# Patient Record
Sex: Male | Born: 1965 | Race: White | Hispanic: No | Smoking: Never smoker
Health system: Southern US, Community
[De-identification: ages and names within clinical notes are randomized; demographics above are authoritative.]

## PROBLEM LIST (undated history)

## (undated) DIAGNOSIS — M109 Gout, unspecified: Secondary | ICD-10-CM

## (undated) DIAGNOSIS — R569 Unspecified convulsions: Secondary | ICD-10-CM

## (undated) DIAGNOSIS — I82409 Acute embolism and thrombosis of unspecified deep veins of unspecified lower extremity: Secondary | ICD-10-CM

## (undated) DIAGNOSIS — I10 Essential (primary) hypertension: Secondary | ICD-10-CM

## (undated) HISTORY — PX: KNEE SURGERY: SHX244

## (undated) HISTORY — PX: ELBOW SURGERY: SHX618

---

## 2015-07-22 ENCOUNTER — Emergency Department (HOSPITAL_COMMUNITY)
Admission: EM | Admit: 2015-07-22 | Discharge: 2015-07-22 | Disposition: A | Discharge status: Home / Self Care | Attending: Emergency Medicine | Admitting: Emergency Medicine

## 2015-07-22 ENCOUNTER — Encounter (HOSPITAL_COMMUNITY): Payer: Self-pay | Admitting: Emergency Medicine

## 2015-07-22 DIAGNOSIS — M10041 Idiopathic gout, right hand: Secondary | ICD-10-CM | POA: Insufficient documentation

## 2015-07-22 DIAGNOSIS — M109 Gout, unspecified: Secondary | ICD-10-CM

## 2015-07-22 DIAGNOSIS — M10042 Idiopathic gout, left hand: Secondary | ICD-10-CM | POA: Diagnosis not present

## 2015-07-22 DIAGNOSIS — I10 Essential (primary) hypertension: Secondary | ICD-10-CM | POA: Insufficient documentation

## 2015-07-22 DIAGNOSIS — Z86718 Personal history of other venous thrombosis and embolism: Secondary | ICD-10-CM | POA: Diagnosis not present

## 2015-07-22 HISTORY — DX: Essential (primary) hypertension: I10

## 2015-07-22 HISTORY — DX: Gout, unspecified: M10.9

## 2015-07-22 HISTORY — DX: Acute embolism and thrombosis of unspecified deep veins of unspecified lower extremity: I82.409

## 2015-07-22 LAB — COMPREHENSIVE METABOLIC PANEL
ALT: 19 U/L (ref 17–63)
AST: 24 U/L (ref 15–41)
Albumin: 3.5 g/dL (ref 3.5–5.0)
Alkaline Phosphatase: 75 U/L (ref 38–126)
Anion gap: 11 (ref 5–15)
BUN: 18 mg/dL (ref 6–20)
CHLORIDE: 101 mmol/L (ref 101–111)
CO2: 21 mmol/L — AB (ref 22–32)
CREATININE: 1.39 mg/dL — AB (ref 0.61–1.24)
Calcium: 9.3 mg/dL (ref 8.9–10.3)
GFR calc Af Amer: >60 mL/min (ref >60)
GFR, EST NON AFRICAN AMERICAN: 59 mL/min — AB (ref >60)
Glucose, Bld: 122 mg/dL — ABNORMAL HIGH (ref 65–99)
POTASSIUM: 4 mmol/L (ref 3.5–5.1)
SODIUM: 133 mmol/L — AB (ref 135–145)
Total Bilirubin: 1.4 mg/dL — ABNORMAL HIGH (ref 0.3–1.2)
Total Protein: 8.1 g/dL (ref 6.5–8.1)

## 2015-07-22 LAB — CBC WITH DIFFERENTIAL/PLATELET
Basophils Absolute: 0 10*3/uL (ref 0.0–0.1)
Basophils Relative: 0 %
EOS ABS: 0 10*3/uL (ref 0.0–0.7)
Eosinophils Relative: 0 %
HCT: 38.5 % — ABNORMAL LOW (ref 39.0–52.0)
HEMOGLOBIN: 13.3 g/dL (ref 13.0–17.0)
LYMPHS ABS: 0.5 10*3/uL — AB (ref 0.7–4.0)
LYMPHS PCT: 4 %
MCH: 35 pg — AB (ref 26.0–34.0)
MCHC: 34.5 g/dL (ref 30.0–36.0)
MCV: 101.3 fL — AB (ref 78.0–100.0)
MONOS PCT: 10 %
Monocytes Absolute: 1.5 10*3/uL — ABNORMAL HIGH (ref 0.1–1.0)
NEUTROS PCT: 86 %
Neutro Abs: 12.5 10*3/uL — ABNORMAL HIGH (ref 1.7–7.7)
Platelets: 229 10*3/uL (ref 150–400)
RBC: 3.8 MIL/uL — AB (ref 4.22–5.81)
RDW: 13.1 % (ref 11.5–15.5)
WBC: 14.6 10*3/uL — AB (ref 4.0–10.5)

## 2015-07-22 LAB — URIC ACID: URIC ACID, SERUM: 7.2 mg/dL (ref 4.4–7.6)

## 2015-07-22 MED ORDER — CEFTRIAXONE SODIUM 250 MG IJ SOLR
250.0000 mg | Freq: Once | INTRAMUSCULAR | Status: AC
  Administered 2015-07-22: 250 mg via INTRAMUSCULAR
  Filled 2015-07-22: qty 250

## 2015-07-22 MED ORDER — INDOMETHACIN 50 MG PO CAPS: 50.0000 mg | ORAL_CAPSULE | Freq: Three times a day (TID) | ORAL | Status: DC

## 2015-07-22 MED ORDER — AZITHROMYCIN 250 MG PO TABS
1000.0000 mg | ORAL_TABLET | Freq: Once | ORAL | Status: AC
  Administered 2015-07-22: 1000 mg via ORAL
  Filled 2015-07-22: qty 4

## 2015-07-22 MED ORDER — PREDNISONE 50 MG PO TABS: ORAL_TABLET | ORAL | Status: DC

## 2015-07-22 MED ORDER — INDOMETHACIN 25 MG PO CAPS
50.0000 mg | ORAL_CAPSULE | Freq: Once | ORAL | Status: AC
  Administered 2015-07-22: 50 mg via ORAL
  Filled 2015-07-22: qty 2

## 2015-07-22 MED ORDER — LIDOCAINE HCL (PF) 1 % IJ SOLN
INTRAMUSCULAR | Status: AC
  Filled 2015-07-22: qty 5

## 2015-07-22 MED ORDER — PREDNISONE 50 MG PO TABS
60.0000 mg | ORAL_TABLET | Freq: Once | ORAL | Status: AC
  Administered 2015-07-22: 60 mg via ORAL
  Filled 2015-07-22: qty 1

## 2015-07-22 NOTE — ED Notes (Signed)
PT c/o pain and swelling to bilateral hands/wrist and bilateral feet and ankles with hx of gout x1 week.

## 2015-07-22 NOTE — Discharge Instructions (Signed)
Gout Gout is when your joints become red, sore, and swell (inflamed). This is caused by the buildup of uric acid crystals in the joints. Uric acid is a chemical that is normally in the blood. If the level of uric acid gets too high in the blood, these crystals form in your joints and tissues. Over time, these crystals can form into masses near the joints and tissues. These masses can destroy bone and cause the bone to look misshapen (deformed). HOME CARE   Do not take aspirin for pain.  Only take medicine as told by your doctor.  Rest the joint as much as you can. When in bed, keep sheets and blankets off painful areas.  Keep the sore joints raised (elevated).  Put warm or cold packs on painful joints. Use of warm or cold packs depends on which works best for you.  Use crutches if the painful joint is in your leg.  Drink enough fluids to keep your pee (urine) clear or pale yellow. Limit alcohol, sugary drinks, and drinks with fructose in them.  Follow your diet instructions. Pay careful attention to how much protein you eat. Include fruits, vegetables, whole grains, and fat-free or low-fat milk products in your daily diet. Talk to your doctor or dietitian about the use of coffee, vitamin C, and cherries. These may help lower uric acid levels.  Keep a healthy body weight. GET HELP RIGHT AWAY IF:   You have watery poop (diarrhea), throw up (vomit), or have any side effects from medicines.  You do not feel better in 24 hours, or you are getting worse.  Your joint becomes suddenly more tender, and you have chills or a fever. MAKE SURE YOU:   Understand these instructions.  Will watch your condition.  Will get help right away if you are not doing well or get worse.   This information is not intended to replace advice given to you by your health care provider. Make sure you discuss any questions you have with your health care provider.   Document Released: 03/26/2008 Document Revised:  07/08/2014 Document Reviewed: 01/29/2012 Elsevier Interactive Patient Education 2016 [employer].   Prescription for prednisone and indomethacin. Return if worse.

## 2015-07-22 NOTE — ED Provider Notes (Signed)
CSN: [id number]     Arrival date & time 07/22/15  1332 History   First MD Initiated Contact with Patient 07/22/15 1443     Chief Complaint  Patient presents with  . Gout     (Consider location/radiation/quality/duration/timing/severity/associated sxs/prior Treatment) HPI.... Patient with known history of gout presents with swelling in bilateral hands. Symptoms are similar to gout attacks in the past. No fever, sweats, chills, trauma. He is a inmate and does not have access to medications. Severity of pain is moderate.  Past Medical History  Diagnosis Date  . Gout   . Hypertension   . DVT (deep venous thrombosis) [facility])    Past Surgical History  Procedure Laterality Date  . Knee surgery    . Elbow surgery     History reviewed. No pertinent family history. Social History  Substance Use Topics  . Smoking status: Never Smoker   . Smokeless tobacco: None  . Alcohol Use: No    Review of Systems  All other systems reviewed and are negative.     Allergies  Codeine  Home Medications   Prior to Admission medications   Medication Sig Start Date End Date Taking? Authorizing Provider  indomethacin (INDOCIN) 50 MG capsule Take 1 capsule (50 mg total) by mouth 3 (three) times daily with meals. 07/22/15   Donnetta Hutching, MD  predniSONE (DELTASONE) 50 MG tablet 1 tablet for 6 days, one half tablet for 6 days 07/22/15   Donnetta Hutching, MD   BP 113/93 mmHg  Pulse 44  Temp(Src) 99.3 F (37.4 C) (Oral)  Resp 16  Ht  (1.905 m)  Wt 233 lb (105.688 kg)  BMI 29.12 kg/m2  SpO2 98% Physical Exam  Constitutional: He is oriented to person, place, and time. He appears well-developed and well-nourished.  HENT:  Head: Normocephalic and atraumatic.  Eyes: Conjunctivae and EOM are normal. Pupils are equal, round, and reactive to light.  Neck: Normal range of motion. Neck supple.  Musculoskeletal:  Bilateral hands are puffy but nontender. Full range of motion.  Neurological: He is alert and  oriented to person, place, and time.  Skin: Skin is warm and dry.  Psychiatric: He has a normal mood and affect. His behavior is normal.  Nursing note and vitals reviewed.   ED Course  Procedures (including critical care time) Labs Review Labs Reviewed  COMPREHENSIVE METABOLIC PANEL - Abnormal; Notable for the following:    Sodium 133 (*)    CO2 21 (*)    Glucose, Bld 122 (*)    Creatinine, Ser 1.39 (*)    Total Bilirubin 1.4 (*)    GFR calc non Af Amer 59 (*)    All other components within normal limits  CBC WITH DIFFERENTIAL/PLATELET - Abnormal; Notable for the following:    WBC 14.6 (*)    RBC 3.80 (*)    HCT 38.5 (*)    MCV 101.3 (*)    MCH 35.0 (*)    Neutro Abs 12.5 (*)    Lymphs Abs 0.5 (*)    Monocytes Absolute 1.5 (*)    All other components within normal limits  URIC ACID    Imaging Review No results found. I have personally reviewed and evaluated these images and lab results as part of my medical decision-making.   EKG Interpretation None      MDM   Final diagnoses:  Acute gout of left hand, unspecified cause  Acute gout of right hand, unspecified cause    Patient has long-standing  history of gout. History and physical consistent with same. Discharge medications prednisone and indomethacin 50 mg    Donnetta Hutching, MD 07/22/15 [phone]

## 2015-08-08 ENCOUNTER — Emergency Department (HOSPITAL_COMMUNITY)

## 2015-08-08 ENCOUNTER — Emergency Department (HOSPITAL_COMMUNITY)
Admission: EM | Admit: 2015-08-08 | Discharge: 2015-08-08 | Disposition: A | Discharge status: Home / Self Care | Attending: Emergency Medicine | Admitting: Emergency Medicine

## 2015-08-08 ENCOUNTER — Encounter (HOSPITAL_COMMUNITY): Payer: Self-pay | Admitting: *Deleted

## 2015-08-08 DIAGNOSIS — Y9389 Activity, other specified: Secondary | ICD-10-CM | POA: Insufficient documentation

## 2015-08-08 DIAGNOSIS — Z79899 Other long term (current) drug therapy: Secondary | ICD-10-CM | POA: Insufficient documentation

## 2015-08-08 DIAGNOSIS — Y998 Other external cause status: Secondary | ICD-10-CM | POA: Insufficient documentation

## 2015-08-08 DIAGNOSIS — Z86718 Personal history of other venous thrombosis and embolism: Secondary | ICD-10-CM | POA: Diagnosis not present

## 2015-08-08 DIAGNOSIS — M109 Gout, unspecified: Secondary | ICD-10-CM | POA: Diagnosis not present

## 2015-08-08 DIAGNOSIS — S0990XA Unspecified injury of head, initial encounter: Secondary | ICD-10-CM | POA: Diagnosis present

## 2015-08-08 DIAGNOSIS — W01198A Fall on same level from slipping, tripping and stumbling with subsequent striking against other object, initial encounter: Secondary | ICD-10-CM | POA: Insufficient documentation

## 2015-08-08 DIAGNOSIS — M1000 Idiopathic gout, unspecified site: Secondary | ICD-10-CM

## 2015-08-08 DIAGNOSIS — E860 Dehydration: Secondary | ICD-10-CM | POA: Diagnosis not present

## 2015-08-08 DIAGNOSIS — S0093XA Contusion of unspecified part of head, initial encounter: Secondary | ICD-10-CM

## 2015-08-08 DIAGNOSIS — S0083XA Contusion of other part of head, initial encounter: Secondary | ICD-10-CM | POA: Insufficient documentation

## 2015-08-08 DIAGNOSIS — W19XXXA Unspecified fall, initial encounter: Secondary | ICD-10-CM

## 2015-08-08 DIAGNOSIS — Y92143 Cell of prison as the place of occurrence of the external cause: Secondary | ICD-10-CM | POA: Insufficient documentation

## 2015-08-08 DIAGNOSIS — S40011A Contusion of right shoulder, initial encounter: Secondary | ICD-10-CM

## 2015-08-08 DIAGNOSIS — S20211A Contusion of right front wall of thorax, initial encounter: Secondary | ICD-10-CM

## 2015-08-08 DIAGNOSIS — I10 Essential (primary) hypertension: Secondary | ICD-10-CM | POA: Insufficient documentation

## 2015-08-08 HISTORY — DX: Unspecified convulsions: R56.9

## 2015-08-08 LAB — URIC ACID: Uric Acid, Serum: 8.8 mg/dL — ABNORMAL HIGH (ref 4.4–7.6)

## 2015-08-08 MED ORDER — SODIUM CHLORIDE 0.9 % IV BOLUS (SEPSIS)
1000.0000 mL | Freq: Once | INTRAVENOUS | Status: AC
  Administered 2015-08-08: 1000 mL via INTRAVENOUS

## 2015-08-08 MED ORDER — KETOROLAC TROMETHAMINE 30 MG/ML IJ SOLN
30.0000 mg | Freq: Once | INTRAMUSCULAR | Status: AC
  Administered 2015-08-08: 30 mg via INTRAVENOUS
  Filled 2015-08-08: qty 1

## 2015-08-08 MED ORDER — COLCHICINE 0.6 MG PO TABS: 0.6000 mg | ORAL_TABLET | Freq: Two times a day (BID) | ORAL | Status: AC

## 2015-08-08 MED ORDER — INDOMETHACIN 50 MG PO CAPS: ORAL_CAPSULE | ORAL | Status: AC

## 2015-08-08 MED ORDER — COLCHICINE 0.6 MG PO TABS
0.6000 mg | ORAL_TABLET | Freq: Once | ORAL | Status: AC
  Administered 2015-08-08: 0.6 mg via ORAL
  Filled 2015-08-08: qty 1

## 2015-08-08 MED ORDER — DEXAMETHASONE SODIUM PHOSPHATE 10 MG/ML IJ SOLN
10.0000 mg | Freq: Once | INTRAMUSCULAR | Status: AC
  Administered 2015-08-08: 10 mg via INTRAVENOUS
  Filled 2015-08-08: qty 1

## 2015-08-08 MED ORDER — PREDNISONE 20 MG PO TABS: ORAL_TABLET | ORAL | Status: AC

## 2015-08-08 MED ORDER — SODIUM CHLORIDE 0.9 % IV BOLUS (SEPSIS): 1000.0000 mL | Freq: Once | INTRAVENOUS | Status: DC

## 2015-08-08 NOTE — ED Notes (Signed)
Pt unable to sign for d/c. Signature pad is not working.

## 2015-08-08 NOTE — ED Provider Notes (Signed)
CSN: [id number]     Arrival date & time 08/08/15  0527 History   First MD Initiated Contact with Patient 08/08/15 053   Chief Complaint  Patient presents with  . Seizures     (Consider location/radiation/quality/duration/timing/severity/associated sxs/prior Treatment) HPI patient states he has a history of gout. He states he was seen in the ED approximately 2 weeks ago, looking at the records it was January 21, and he states he was given steroids and indomethacin in the ED however when he returned to jail he was only getting the medication sporadically. He states he has not had any medication for the past week. He reports 3 days ago he started having increasing swelling of his fingers and hands and wrists with increasing pain. He has had nausea without vomiting or diarrhea but states he is eating normally. He states he feels weak and lightheaded and dizzy for the past 2 weeks. He states this morning he woke up and had used the bathroom. He states when he stood up he felt hot and sweaty and he passed out. The jail personnel states when he fell he hit the back of his head on the wall and he hit his right chest and shoulder on the toilet. He was only briefly unconscious with no post ictal state. He was incontinent of urine. EMS reports his CBG was 145. Patient states on January 18 he was having a court date and he was sitting on the bed he felt dizzy and sweaty and felt like he was going to pass out. He states he was helped back to his cell but he did not actually pass out. He is complaining of pain in his head, neck, right shoulder, right ribs, and the gout in both his hands and wrists.  PCP none  Past Medical History  Diagnosis Date  . Gout   . Hypertension   . DVT (deep venous thrombosis) ([facility])   . Seizures [facility])    Past Surgical History  Procedure Laterality Date  . Knee surgery    . Elbow surgery     No family history on file. Social History  Substance Use Topics  . Smoking status:  Never Smoker   . Smokeless tobacco: None  . Alcohol Use: No   in jail the past 22 days  Review of Systems  All other systems reviewed and are negative.     Allergies  Codeine  Home Medications   Prior to Admission medications   Medication Sig Start Date End Date Taking? Authorizing Provider  colchicine 0.6 MG tablet Take 1 tablet (0.6 mg total) by mouth 2 (two) times daily. 08/08/15   Devoria Albe, MD  indomethacin (INDOCIN) 50 MG capsule Take 1 by mouth 4 times a day 2 days, then 1 by mouth 3 times a day 3 days, then 1 by mouth twice a day 3 days, then 1 by mouth daily 3 days 08/08/15   Devoria Albe, MD  predniSONE (DELTASONE) 20 MG tablet Take 3 po QD x 3d , then 2 po QD x 3d then 1 po QD x 3d 08/08/15   Devoria Albe, MD   BP 146/96 mmHg  Pulse 94  Temp(Src) 98 F (36.7 C) (Oral)  Resp 21  Ht  (1.905 m)  Wt 237 lb (107.502 kg)  BMI 29.62 kg/m2  SpO2 100%  Vital signs normal   Physical Exam  Constitutional: He is oriented to person, place, and time. He appears well-developed and well-nourished.  Non-toxic appearance. He does  not appear ill. No distress.  HENT:  Head: Normocephalic.  Right Ear: External ear normal.  Left Ear: External ear normal.  Nose: Nose normal. No mucosal edema or rhinorrhea.  Mouth/Throat: Oropharynx is clear and moist and mucous membranes are normal. No dental abscesses or uvula swelling.  Tender to posterior scalp without laceration.  Eyes: Conjunctivae and EOM are normal. Pupils are equal, round, and reactive to light.  Neck: Full passive range of motion without pain.  Patient has c-collar in place but states he has neck pain  Cardiovascular: Normal rate, regular rhythm and normal heart sounds.  Exam reveals no gallop and no friction rub.   No murmur heard. Pulmonary/Chest: Effort normal and breath sounds normal. No respiratory distress. He has no wheezes. He has no rhonchi. He has no rales.   He exhibits tenderness. He exhibits no crepitus.    Patient has pain to palpation in his right lower chest wall in the posterior axillary line without crepitance or obvious bruising.  Abdominal: Soft. Normal appearance and bowel sounds are normal. He exhibits no distension. There is no tenderness. There is no rebound and no guarding.  Musculoskeletal: Normal range of motion. He exhibits no edema or tenderness.  Patient's noted to have diffuse swelling of the fingers of his right hand with mild swelling of the dorsum of his right hand. He is noted to have swelling of his left fingers and even more swelling of the dorsum of his left hand and left wrist with tenderness to even light touch. His other extremities do not appear to be as tender. He has tenderness in his right shoulder however he is able to abduct the shoulder approximately 30. His right clavicle is nontender.  Neurological: He is alert and oriented to person, place, and time. He has normal strength. No cranial nerve deficit.  Skin: Skin is warm, dry and intact. No rash noted. No erythema. No pallor.  Psychiatric: He has a normal mood and affect. His speech is normal and behavior is normal. His mood appears not anxious.  Nursing note and vitals reviewed.   ED Course  Procedures (including critical care time)  , Medications  sodium chloride 0.9 % bolus 1,000 mL (not administered)  ketorolac (TORADOL) 30 MG/ML injection 30 mg (30 mg Intravenous Given 08/08/15 0749)  sodium chloride 0.9 % bolus 1,000 mL (1,000 mLs Intravenous New Bag/Given 08/08/15 0748)  colchicine tablet 0.6 mg (0.6 mg Oral Given 08/08/15 0749)  dexamethasone (DECADRON) injection 10 mg (10 mg Intravenous Given 08/08/15 0749)    Patient was given a liter fluids. After his head CT results that he was given Toradol IV for pain which will also help with his acute gout.  Patient was given his x-ray results and CT results. He was given IV fluids. We discussed that I can't control what medication he gets in the jail however I can  recommend treatment protocol call.    Labs Review Results for orders placed or performed during the hospital encounter of 08/08/15  Uric acid  Result Value Ref Range   Uric Acid, Serum 8.8 (H) 4.4 - 7.6 mg/dL   Laboratory interpretation all normal except elevated uric acid     Imaging Review Dg Ribs Unilateral W/chest Right  08/08/2015  CLINICAL DATA:  50 year old male status post seizure and fell to the ground on the right side. Right lateral rib and shoulder pain. Initial encounter. Personal history of pulmonary emboli in 2016. EXAM: RIGHT RIBS AND CHEST - 3+ VIEW COMPARISON:  [facility] chest radiographs and chest CTA 08/06/2014 FINDINGS: Lung volumes are stable and within normal limits. Normal cardiac size and mediastinal contours. Visualized tracheal air column is within normal limits. No pneumothorax, pulmonary edema, pleural effusion or confluent pulmonary opacity. Chronic right lateral third rib fracture re- demonstrated. Chronic lateral right seventh rib fracture re - demonstrated. Chronic right lateral ninth rib fracture re- demonstrated. No acute displaced right rib fracture identified. Questionable nondisplaced right ninth rib fracture seen only on image 3. Outside of the ribs, No acute osseous abnormality identified. IMPRESSION: 1. Multiple chronic right rib fractures. Questionable nondisplaced acute right ninth fracture. 2.  No acute cardiopulmonary abnormality. Electronically Signed   By: Odessa Fleming M.D.   On: 08/08/2015 07:27   Dg Shoulder Right  08/08/2015  CLINICAL DATA:  50 year old male status post seizure and fell to the ground on the right side. Right lateral rib and shoulder pain. Initial encounter. Personal history of pulmonary emboli in 2016. EXAM: RIGHT SHOULDER - 2+ VIEW COMPARISON:  Chest radiographs from today and earlier. FINDINGS: Two views of the right shoulder. No glenohumeral joint dislocation. Proximal right humerus intact. Degenerative osteophytosis  at the right acromioclavicular joint. Right scapula and visible clavicle appear intact. Stable visible right ribs and lung parenchyma. IMPRESSION: No acute fracture or dislocation identified about the right shoulder. Electronically Signed   By: Odessa Fleming M.D.   On: 08/08/2015 07:28   Ct Head Wo Contrast  Ct Cervical Spine Wo Contrast  08/08/2015  CLINICAL DATA:  Status post fall, hitting head on toilet. Postictal state, with urinary incontinence. Concern for cervical spine injury. Initial encounter. EXAM: CT HEAD WITHOUT CONTRAST CT CERVICAL SPINE WITHOUT CONTRAST TECHNIQUE: Multidetector CT imaging of the head and cervical spine was performed following the standard protocol without intravenous contrast. Multiplanar CT image reconstructions of the cervical spine were also generated. COMPARISON:  None. FINDINGS: CT HEAD FINDINGS There is no evidence of acute infarction, mass lesion, or intra- or extra-axial hemorrhage on CT. Prominence of the sulci reflects mild cortical volume loss. Mild cerebellar atrophy is noted. The brainstem and fourth ventricle are within normal limits. The basal ganglia are unremarkable in appearance. The cerebral hemispheres demonstrate grossly normal gray-white differentiation. No mass effect or midline shift is seen. There is no evidence of fracture; visualized osseous structures are unremarkable in appearance. The orbits are within normal limits. The paranasal sinuses and mastoid air cells are well-aerated. No significant soft tissue abnormalities are seen. CT CERVICAL SPINE FINDINGS There is no evidence of fracture or subluxation. Vertebral bodies demonstrate normal height and alignment. Intervertebral disc spaces are preserved. Prevertebral soft tissues are within normal limits. The visualized neural foramina are grossly unremarkable. Scattered anterior osteophytes are noted along the cervical spine. The thyroid gland is unremarkable in appearance. The visualized lung apices are  clear. No significant soft tissue abnormalities are seen. IMPRESSION: 1. No evidence of traumatic intracranial injury or fracture. 2. No evidence of fracture or subluxation along the cervical spine. 3. Mild cortical volume loss noted. Electronically Signed   By: Roanna Raider M.D.   On: 08/08/2015 06:53   I have personally reviewed and evaluated these images and lab results as part of my medical decision-making.     ED ECG REPORT   Date: 08/08/2015  Rate: 95  Rhythm: normal sinus rhythm  QRS Axis: normal  Intervals: PR shortened  ST/T Wave abnormalities: normal  Conduction Disutrbances:none  Narrative Interpretation:   Old EKG Reviewed: none available  I have  personally reviewed the EKG tracing and agree with the computerized printout as noted.   MDM   Final diagnoses:  Dehydration  Acute idiopathic gout, unspecified site  Fall, initial encounter  Contusion of head, initial encounter  Contusion of shoulder, right, initial encounter  Contusion, chest wall, right, initial encounter    New Prescriptions   COLCHICINE 0.6 MG TABLET    Take 1 tablet (0.6 mg total) by mouth 2 (two) times daily.   INDOMETHACIN (INDOCIN) 50 MG CAPSULE    Take 1 by mouth 4 times a day 2 days, then 1 by mouth 3 times a day 3 days, then 1 by mouth twice a day 3 days, then 1 by mouth daily 3 days   PREDNISONE (DELTASONE) 20 MG TABLET    Take 3 po QD x 3d , then 2 po QD x 3d then 1 po QD x 3d    Plan discharge  Devoria Albe, MD, Concha Pyo, MD 08/08/15 [phone]

## 2015-08-08 NOTE — ED Notes (Signed)
Pt arrived from jail by EMS. Reported pt was seen w/ slight shacking of his head after falling striking head of toliet. EMS states pt was postictal & lost urine.

## 2015-08-08 NOTE — ED Notes (Signed)
Pt states fell on right side, complaining of right shoulder & rib pain. Pt says he was being treated for DVT in September, was on xarelto, no treatment at current.

## 2015-08-08 NOTE — Discharge Instructions (Signed)
Take the gout medication as prescribed. Try to drink plenty of fluids so you do not get dehydrated. 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 Continue trying to follow the low purine diet. Return to the ED for any problems listed on the head injury sheet, or if you struggle to breathe or get a cough or fever.  Chest Contusion A contusion is a deep bruise. Bruises happen when an injury causes bleeding under the skin. Signs of bruising include pain, puffiness (swelling), and discolored skin. The bruise may turn blue, purple, or yellow.  HOME CARE  Put ice on the injured area.  Put ice in a plastic bag.  Place a towel between the skin and the bag.  Leave the ice on for 15-20 minutes at a time, 03-04 times a day for the first 48 hours.  Only take medicine as told by your doctor.  Rest.  Take deep breaths (deep-breathing exercises) as told by your doctor.  Stop smoking if you smoke.  Do not lift objects over 5 pounds (2.3 kilograms) for 3 days or longer if told by your doctor. GET HELP RIGHT AWAY IF:   You have more bruising or puffiness.  You have pain that gets worse.  You have trouble breathing.  You are dizzy, weak, or pass out (faint).  You have blood in your pee (urine) or poop (stool).  You cough up or throw up (vomit) blood.  Your puffiness or pain is not helped with medicines. MAKE SURE YOU:   Understand these instructions.  Will watch your condition.  Will get help right away if you are not doing well or get worse.   This information is not intended to replace advice given to you by your health care provider. Make sure you discuss any questions you have with your health care provider.   Document Released: 12/04/2007 Document Revised: 03/11/2012 Document Reviewed: 12/09/2011 Elsevier Interactive Patient Education 2016 [employer].    Gout Gout is an inflammatory arthritis caused by a buildup of uric acid crystals in  the joints. Uric acid is a chemical that is normally present in the blood. When the level of uric acid in the blood is too high it can form crystals that deposit in your joints and tissues. This causes joint redness, soreness, and swelling (inflammation). Repeat attacks are common. Over time, uric acid crystals can form into masses (tophi) near a joint, destroying bone and causing disfigurement. Gout is treatable and often preventable. CAUSES  The disease begins with elevated levels of uric acid in the blood. Uric acid is produced by your body when it breaks down a naturally found substance called purines. Certain foods you eat, such as meats and fish, contain high amounts of purines. Causes of an elevated uric acid level include:  Being passed down from parent to child (heredity).  Diseases that cause increased uric acid production (such as obesity, psoriasis, and certain cancers).  Excessive alcohol use.  Diet, especially diets rich in meat and seafood.  Medicines, including certain cancer-fighting medicines (chemotherapy), water pills (diuretics), and aspirin.  Chronic kidney disease. The kidneys are no longer able to remove uric acid well.  Problems with metabolism. Conditions strongly associated with gout include:  Obesity.  High blood pressure.  High cholesterol.  Diabetes. Not everyone with elevated uric acid levels gets gout. It is not understood why some people get gout and others do not. Surgery, joint injury, and eating too  much of certain foods are some of the factors that can lead to gout attacks. SYMPTOMS   An attack of gout comes on quickly. It causes intense pain with redness, swelling, and warmth in a joint.  Fever can occur.  Often, only one joint is involved. Certain joints are more commonly involved:  Base of the big toe.  Knee.  Ankle.  Wrist.  Finger. Without treatment, an attack usually goes away in a few days to weeks. Between attacks, you usually  will not have symptoms, which is different from many other forms of arthritis. DIAGNOSIS  Your caregiver will suspect gout based on your symptoms and exam. In some cases, tests may be recommended. The tests may include:  Blood tests.  Urine tests.  X-rays.  Joint fluid exam. This exam requires a needle to remove fluid from the joint (arthrocentesis). Using a microscope, gout is confirmed when uric acid crystals are seen in the joint fluid. TREATMENT  There are two phases to gout treatment: treating the sudden onset (acute) attack and preventing attacks (prophylaxis).  Treatment of an Acute Attack.  Medicines are used. These include anti-inflammatory medicines or steroid medicines.  An injection of steroid medicine into the affected joint is sometimes necessary.  The painful joint is rested. Movement can worsen the arthritis.  You may use warm or cold treatments on painful joints, depending which works best for you.  Treatment to Prevent Attacks.  If you suffer from frequent gout attacks, your caregiver may advise preventive medicine. These medicines are started after the acute attack subsides. These medicines either help your kidneys eliminate uric acid from your body or decrease your uric acid production. You may need to stay on these medicines for a very long time.  The early phase of treatment with preventive medicine can be associated with an increase in acute gout attacks. For this reason, during the first few months of treatment, your caregiver may also advise you to take medicines usually used for acute gout treatment. Be sure you understand your caregiver's directions. Your caregiver may make several adjustments to your medicine dose before these medicines are effective.  Discuss dietary treatment with your caregiver or dietitian. Alcohol and drinks high in sugar and fructose and foods such as meat, poultry, and seafood can increase uric acid levels. Your caregiver or dietitian  can advise you on drinks and foods that should be limited. HOME CARE INSTRUCTIONS   Do not take aspirin to relieve pain. This raises uric acid levels.  Only take over-the-counter or prescription medicines for pain, discomfort, or fever as directed by your caregiver.  Rest the joint as much as possible. When in bed, keep sheets and blankets off painful areas.  Keep the affected joint raised (elevated).  Apply warm or cold treatments to painful joints. Use of warm or cold treatments depends on which works best for you.  Use crutches if the painful joint is in your leg.  Drink enough fluids to keep your urine clear or pale yellow. This helps your body get rid of uric acid. Limit alcohol, sugary drinks, and fructose drinks.  Follow your dietary instructions. Pay careful attention to the amount of protein you eat. Your daily diet should emphasize fruits, vegetables, whole grains, and fat-free or low-fat milk products. Discuss the use of coffee, vitamin C, and cherries with your caregiver or dietitian. These may be helpful in lowering uric acid levels.  Maintain a healthy body weight. SEEK MEDICAL CARE IF:   You develop diarrhea, vomiting,  or any side effects from medicines.  You do not feel better in 24 hours, or you are getting worse. SEEK IMMEDIATE MEDICAL CARE IF:   Your joint becomes suddenly more tender, and you have chills or a fever. MAKE SURE YOU:   Understand these instructions.  Will watch your condition.  Will get help right away if you are not doing well or get worse.   This information is not intended to replace advice given to you by your health care provider. Make sure you discuss any questions you have with your health care provider.   Document Released: 06/14/2000 Document Revised: 07/08/2014 Document Reviewed: 01/29/2012 Elsevier Interactive Patient Education 2016 [employer] Inc[employer]  Low-Purine Diet Purines are compounds that affect the level of uric acid in your  body. A low-purine diet is a diet that is low in purines. Eating a low-purine diet can prevent the level of uric acid in your body from getting too high and causing gout or kidney stones or both. WHAT DO I NEED TO KNOW ABOUT THIS DIET?  Choose low-purine foods. Examples of low-purine foods are listed in the next section.  Drink plenty of fluids, especially water. Fluids can help remove uric acid from your body. Try to drink 8-16 cups (1.9-3.8 L) a day.  Limit foods high in fat, especially saturated fat, as fat makes it harder for the body to get rid of uric acid. Foods high in saturated fat include pizza, cheese, ice cream, whole milk, fried foods, and gravies. Choose foods that are lower in fat and lean sources of protein. Use olive oil when cooking as it contains healthy fats that are not high in saturated fat.  Limit alcohol. Alcohol interferes with the elimination of uric acid from your body. If you are having a gout attack, avoid all alcohol.  Keep in mind that different people's bodies react differently to different foods. You will probably learn over time which foods do or do not affect you. If you discover that a food tends to cause your gout to flare up, avoid eating that food. You can more freely enjoy foods that do not cause problems. If you have any questions about a food item, talk to your dietitian or health care provider. WHICH FOODS ARE LOW, MODERATE, AND HIGH IN PURINES? The following is a list of foods that are low, moderate, and high in purines. You can eat any amount of the foods that are low in purines. You may be able to have small amounts of foods that are moderate in purines. Ask your health care provider how much of a food moderate in purines you can have. Avoid foods high in purines. Grains  Foods low in purines: Enriched white bread, pasta, rice, cake, cornbread, popcorn.  Foods moderate in purines: Whole-grain breads and cereals, wheat germ, bran, oatmeal. Uncooked  oatmeal. Dry wheat bran or wheat germ.  Foods high in purines: Pancakes, [place] toast, biscuits, muffins. Vegetables  Foods low in purines: All vegetables, except those that are moderate in purines.  Foods moderate in purines: Asparagus, cauliflower, spinach, mushrooms, green peas. Fruits  All fruits are low in purines. Meats and other Protein Foods  Foods low in purines: Eggs, nuts, peanut butter.  Foods moderate in purines: 80-90% lean beef, lamb, veal, pork, poultry, fish, eggs, peanut butter, nuts. Crab, lobster, oysters, and shrimp. Cooked dried beans, peas, and lentils.  Foods high in purines: Anchovies, sardines, herring, mussels, tuna, codfish, scallops, trout, and haddock. Tomasa Blase. Organ meats (such as liver  or kidney). Tripe. Game meat. Goose. Sweetbreads. Dairy  All dairy foods are low in purines. Low-fat and fat-free dairy products are best because they are low in saturated fat. Beverages  Drinks low in purines: Water, carbonated beverages, tea, coffee, cocoa.  Drinks moderate in purines: Soft drinks and other drinks sweetened with high-fructose corn syrup. Juices. To find whether a food or drink is sweetened with high-fructose corn syrup, look at the ingredients list.  Drinks high in purines: Alcoholic beverages (such as beer). Condiments  Foods low in purines: Salt, herbs, olives, pickles, relishes, vinegar.  Foods moderate in purines: Butter, margarine, oils, mayonnaise. Fats and Oils  Foods low in purines: All types, except gravies and sauces made with meat.  Foods high in purines: Gravies and sauces made with meat. Other Foods  Foods low in purines: Sugars, sweets, gelatin. Cake. Soups made without meat.  Foods moderate in purines: Meat-based or fish-based soups, broths, or bouillons. Foods and drinks sweetened with high-fructose corn syrup.  Foods high in purines: High-fat desserts (such as ice cream, cookies, cakes, pies, doughnuts, and  chocolate). Contact your dietitian for more information on foods that are not listed here.   This information is not intended to replace advice given to you by your health care provider. Make sure you discuss any questions you have with your health care provider.   Document Released: 10/12/2010 Document Revised: 06/22/2013 Document Reviewed: 05/24/2013 Elsevier Interactive Patient Education 2016 [employer] Inc[employer]  Head Injury, Adult You have a head injury. Headaches and throwing up (vomiting) are common after a head injury. It should be easy to wake up from sleeping. Sometimes you must stay in the hospital. Most problems happen within the first 24 hours. Side effects may occur up to 7-10 days after the injury.  WHAT ARE THE TYPES OF HEAD INJURIES? Head injuries can be as minor as a bump. Some head injuries can be more severe. More severe head injuries include:  A jarring injury to the brain (concussion).  A bruise of the brain (contusion). This mean there is bleeding in the brain that can cause swelling.  A cracked skull (skull fracture).  Bleeding in the brain that collects, clots, and forms a bump (hematoma). WHEN SHOULD I GET HELP RIGHT AWAY?   You are confused or sleepy.  You cannot be woken up.  You feel sick to your stomach (nauseous) or keep throwing up (vomiting).  Your dizziness or unsteadiness is getting worse.  You have very bad, lasting headaches that are not helped by medicine. Take medicines only as told by your doctor.  You cannot use your arms or legs like normal.  You cannot walk.  You notice changes in the black spots in the center of the colored part of your eye (pupil).  You have clear or bloody fluid coming from your nose or ears.  You have trouble seeing. During the next 24 hours after the injury, you must stay with someone who can watch you. This person should get help right away (call 911 in the [place] if you start to shake and are not able to control it  (have seizures), you pass out, or you are unable to wake up. HOW CAN I PREVENT A HEAD INJURY IN THE FUTURE?  Wear seat belts.  Wear a helmet while bike riding and playing sports like football.  Stay away from dangerous activities around the house. WHEN CAN I RETURN TO NORMAL ACTIVITIES AND ATHLETICS? See your doctor before doing these activities. You should  not do normal activities or play contact sports until 1 week after the following symptoms have stopped:  Headache that does not go away.  Dizziness.  Poor attention.  Confusion.  Memory problems.  Sickness to your stomach or throwing up.  Tiredness.  Fussiness.  Bothered by bright lights or loud noises.  Anxiousness or depression.  Restless sleep. MAKE SURE YOU:   Understand these instructions.  Will watch your condition.  Will get help right away if you are not doing well or get worse.   This information is not intended to replace advice given to you by your health care provider. Make sure you discuss any questions you have with your health care provider.   Document Released: 05/30/2008 Document Revised: 07/08/2014 Document Reviewed: 02/22/2013 Elsevier Interactive Patient Education [employer]

## 2017-06-11 IMAGING — CT CT CERVICAL SPINE W/O CM
4 of 5 series · 15 of 33 positions shown, 17 images · non-contrast
Comparison: None.

CLINICAL DATA: Status post fall, hitting head on toilet. Postictal
state, with urinary incontinence. Concern for cervical spine injury.
Initial encounter.

EXAM:
CT HEAD WITHOUT CONTRAST
CT CERVICAL SPINE WITHOUT CONTRAST
TECHNIQUE: Multidetector CT imaging of the head and cervical spine was
performed following the standard protocol without intravenous
contrast. Multiplanar CT image reconstructions of the cervical spine
were also generated.

[Series 5: cervical st 2.0 b31s · axial · 0.36mm/px · z∈[+95,+205]mm · 4 of 93 slices shown, 5 images]
[im 19/93  soft-tissue]
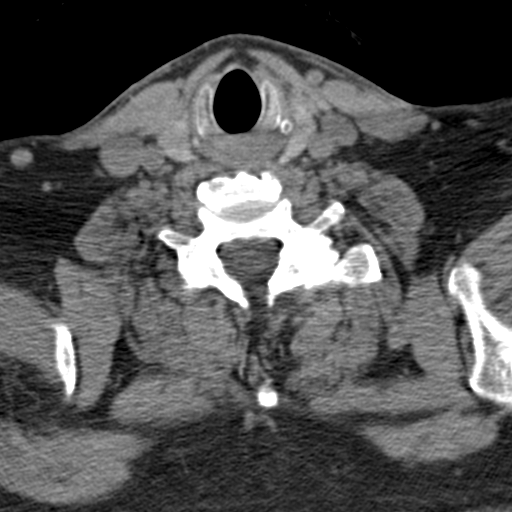
[im 19/93  bone]
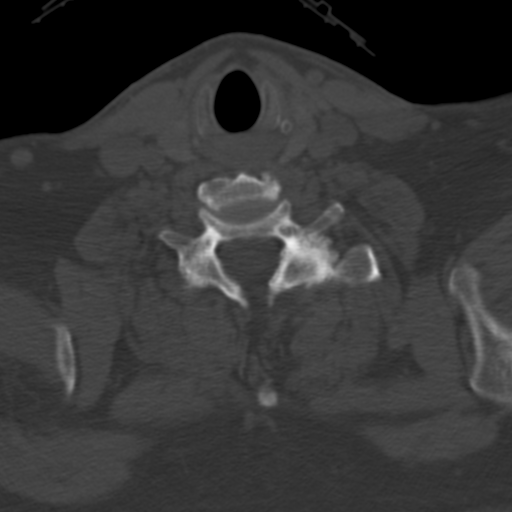
[im 37/93  bone]
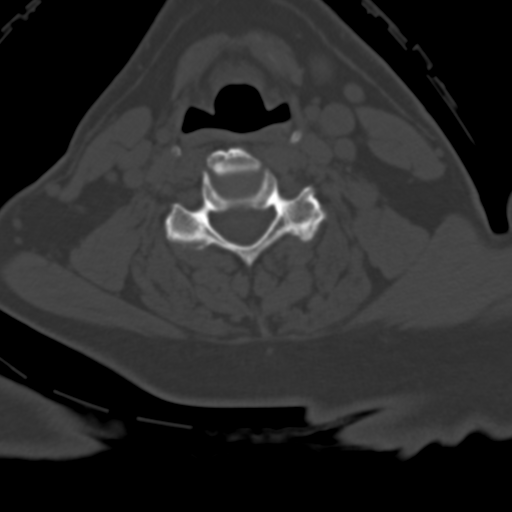
[im 56/93  bone]
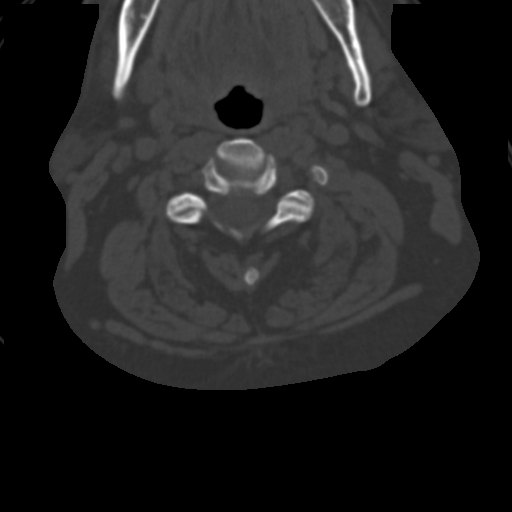
[im 74/93  bone]
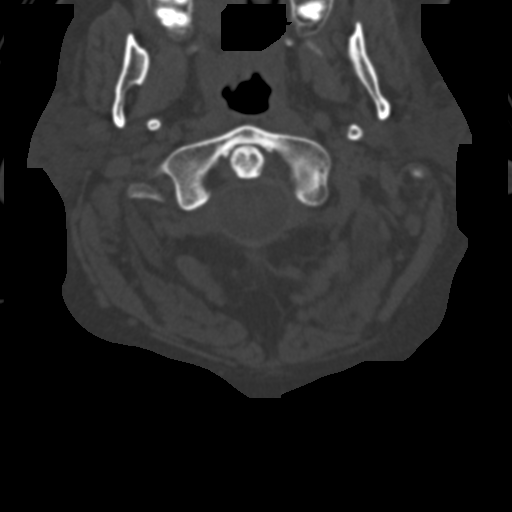

[Series 7: sagittal bone 2.0 · sagittal · 0.37mm/px · 5 of 59 slices shown, 6 images]
[im 20/59  bone]
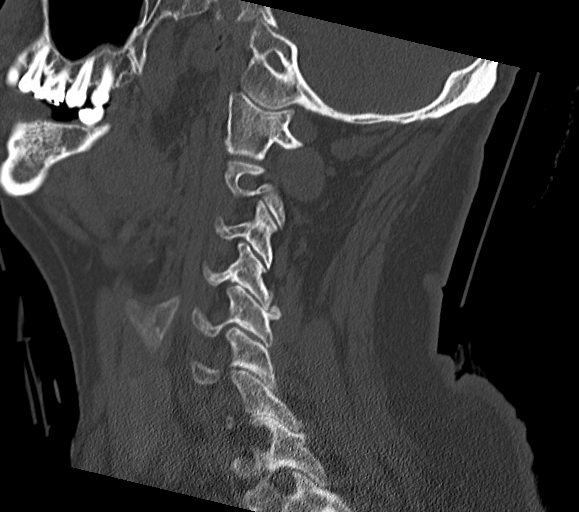
[im 25/59  bone]
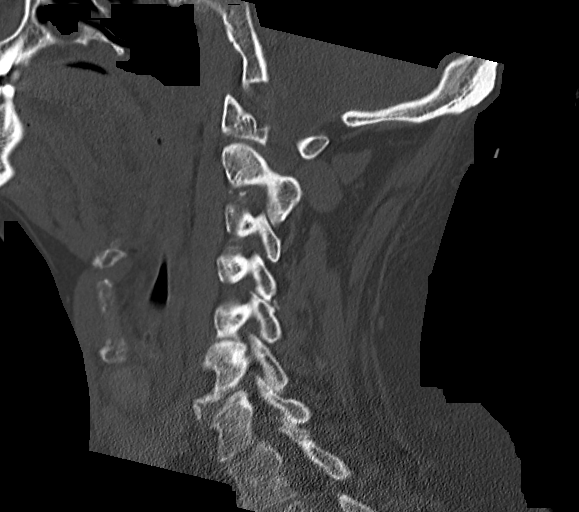
[im 30/59  soft-tissue]
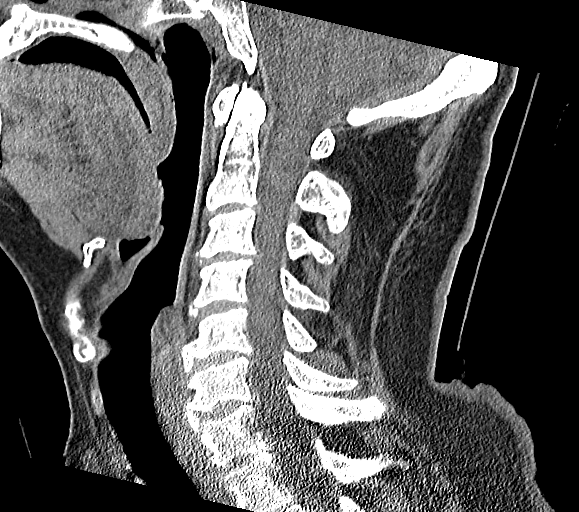
[im 30/59  bone]
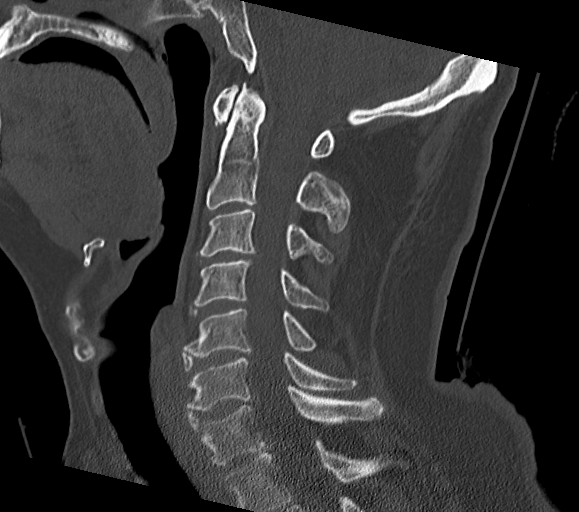
[im 34/59  bone]
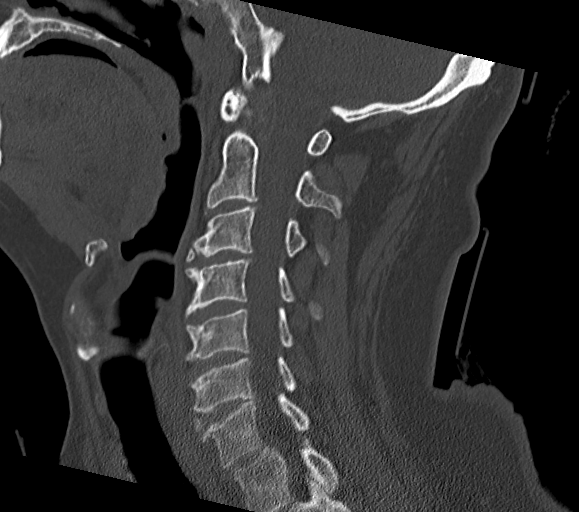
[im 39/59  bone]
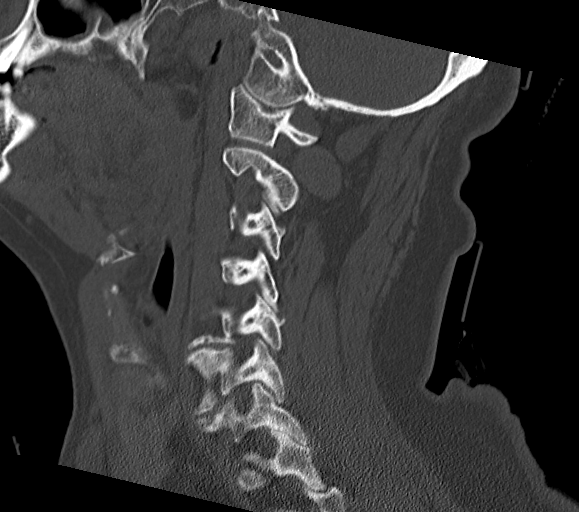

[Series 8: coronal bone 2.0 · coronal · 0.38mm/px · 3 of 60 slices shown]
[im 12/60  bone]
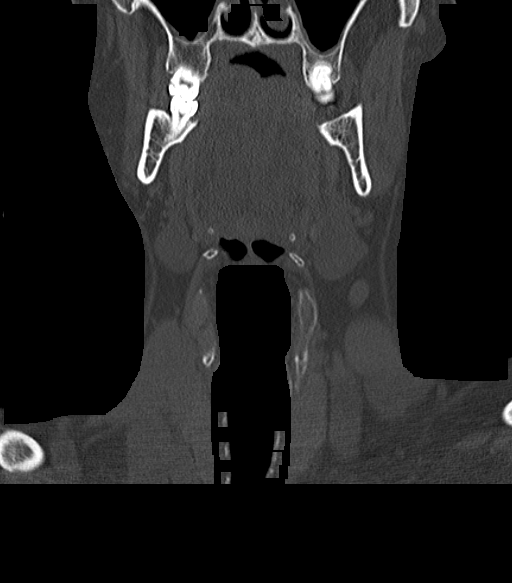
[im 24/60  bone]
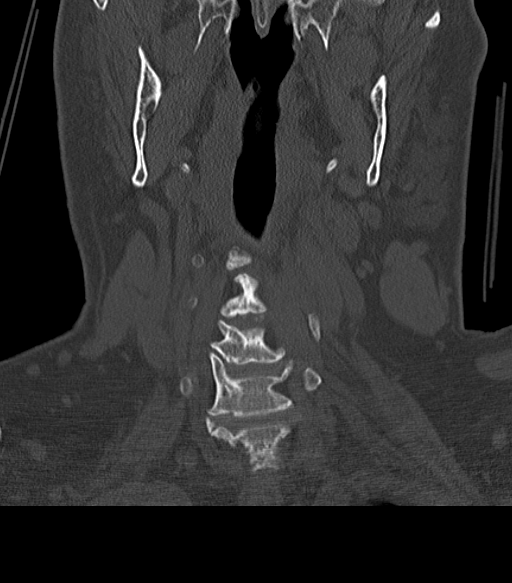
[im 36/60  bone]
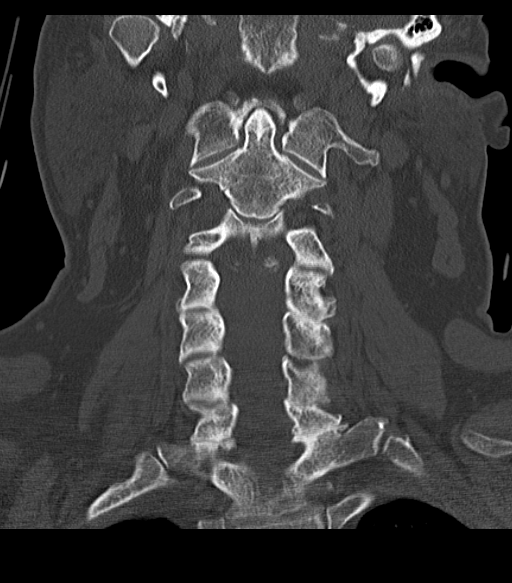

[Series 9: axial bone 2.0 · axial · 0.22mm/px · z∈[+74,+146]mm · 3 of 98 slices shown]
[im 20/98  bone]
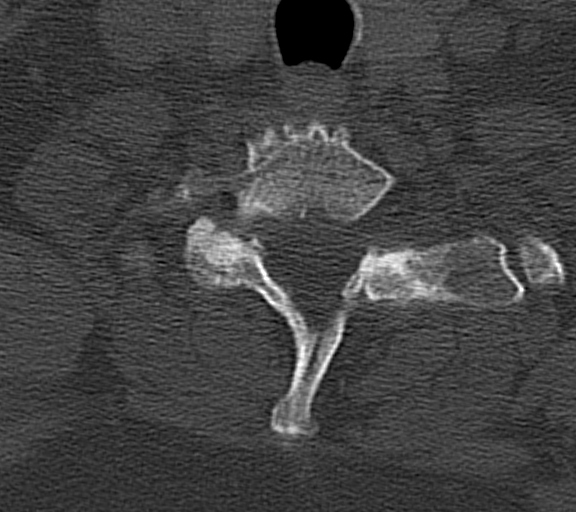
[im 39/98  bone]
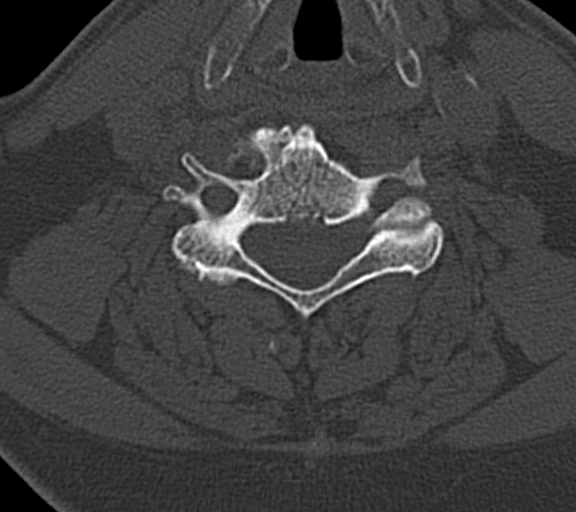
[im 59/98  bone]
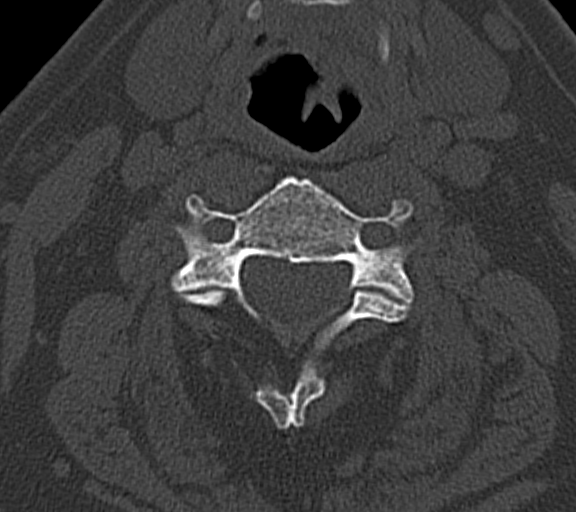

[15 of 33 positions shown; findings below may reference images not displayed]

FINDINGS: CT HEAD FINDINGS

There is no evidence of acute infarction, mass lesion, or intra- or
extra-axial hemorrhage on CT.

Prominence of the sulci reflects mild cortical volume loss. Mild
cerebellar atrophy is noted.

The brainstem and fourth ventricle are within normal limits. The
basal ganglia are unremarkable in appearance. The cerebral
hemispheres demonstrate grossly normal gray-white differentiation.
No mass effect or midline shift is seen.

There is no evidence of fracture; visualized osseous structures are
unremarkable in appearance. The orbits are within normal limits. The
paranasal sinuses and mastoid air cells are well-aerated. No
significant soft tissue abnormalities are seen.

CT CERVICAL SPINE FINDINGS

There is no evidence of fracture or subluxation. Vertebral bodies
demonstrate normal height and alignment. Intervertebral disc spaces
are preserved. Prevertebral soft tissues are within normal limits.
The visualized neural foramina are grossly unremarkable. Scattered
anterior osteophytes are noted along the cervical spine.

The thyroid gland is unremarkable in appearance. The visualized lung
apices are clear. No significant soft tissue abnormalities are seen.
IMPRESSION: 1. No evidence of traumatic intracranial injury or fracture.
2. No evidence of fracture or subluxation along the cervical spine.
3. Mild cortical volume loss noted.
